# Patient Record
Sex: Male | Born: 1985 | Race: Black or African American | Hispanic: No | Marital: Single
Health system: Southern US, Community
[De-identification: ages and names within clinical notes are randomized; demographics above are authoritative.]

---
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
## 2005-07-14 ENCOUNTER — Emergency Department: Payer: Self-pay | Admitting: Emergency Medicine

## 2006-02-08 ENCOUNTER — Emergency Department: Payer: Self-pay | Admitting: Emergency Medicine

## 2011-06-26 ENCOUNTER — Emergency Department: Payer: Self-pay | Admitting: Emergency Medicine

## 2017-01-06 ENCOUNTER — Encounter: Payer: Self-pay | Admitting: Emergency Medicine

## 2017-01-06 ENCOUNTER — Emergency Department
Admission: EM | Admit: 2017-01-06 | Discharge: 2017-01-06 | Disposition: A | Discharge status: Home / Self Care | Payer: Self-pay | Attending: Emergency Medicine | Admitting: Emergency Medicine

## 2017-01-06 DIAGNOSIS — F17210 Nicotine dependence, cigarettes, uncomplicated: Secondary | ICD-10-CM | POA: Insufficient documentation

## 2017-01-06 DIAGNOSIS — K2900 Acute gastritis without bleeding: Secondary | ICD-10-CM | POA: Insufficient documentation

## 2017-01-06 LAB — COMPREHENSIVE METABOLIC PANEL
ALT: 19 U/L (ref 17–63)
AST: 27 U/L (ref 15–41)
Albumin: 4.2 g/dL (ref 3.5–5.0)
Alkaline Phosphatase: 51 U/L (ref 38–126)
Anion gap: 8 (ref 5–15)
BILIRUBIN TOTAL: 0.7 mg/dL (ref 0.3–1.2)
BUN: 11 mg/dL (ref 6–20)
CO2: 26 mmol/L (ref 22–32)
CREATININE: 0.92 mg/dL (ref 0.61–1.24)
Calcium: 9 mg/dL (ref 8.9–10.3)
Chloride: 105 mmol/L (ref 101–111)
GFR calc Af Amer: >60 mL/min (ref >60)
Glucose, Bld: 90 mg/dL (ref 65–99)
Potassium: 3.9 mmol/L (ref 3.5–5.1)
Sodium: 139 mmol/L (ref 135–145)
TOTAL PROTEIN: 7.7 g/dL (ref 6.5–8.1)

## 2017-01-06 LAB — CBC
HEMATOCRIT: 47 % (ref 40.0–52.0)
HEMOGLOBIN: 16.4 g/dL (ref 13.0–18.0)
MCH: 26.9 pg (ref 26.0–34.0)
MCHC: 34.9 g/dL (ref 32.0–36.0)
MCV: 77.1 fL — AB (ref 80.0–100.0)
Platelets: 223 10*3/uL (ref 150–440)
RBC: 6.09 MIL/uL — AB (ref 4.40–5.90)
RDW: 13.9 % (ref 11.5–14.5)
WBC: 8.3 10*3/uL (ref 3.8–10.6)

## 2017-01-06 LAB — LIPASE, BLOOD: Lipase: 24 U/L (ref 11–51)

## 2017-01-06 MED ORDER — GI COCKTAIL ~~LOC~~
ORAL | Status: AC
  Administered 2017-01-06: 30 mL via ORAL
  Filled 2017-01-06: qty 30

## 2017-01-06 MED ORDER — PANTOPRAZOLE SODIUM 20 MG PO TBEC: 20.0000 mg | DELAYED_RELEASE_TABLET | Freq: Every day | ORAL | 1 refills | Status: AC

## 2017-01-06 MED ORDER — GI COCKTAIL ~~LOC~~
30.0000 mL | Freq: Once | ORAL | Status: AC
  Administered 2017-01-06: 30 mL via ORAL

## 2017-01-06 NOTE — ED Provider Notes (Signed)
[facility] Emergency Department Provider Note   ____________________________________________    I have reviewed the triage vital signs and the nursing notes.   HISTORY  Chief Complaint Abdominal Pain     HPI Francisco [name] is a 31 y.o. male who presents with complaints of epigastric abdominal pain which she reports as moderate to severe. Patient reports the pain developed this morning has gotten worse through the day. Patient does admit to smoking marijuana and drinking lots of alcohol last night. No history of abdominal surgery. He has never had this before. Normal stools reported. Mild nausea but no vomiting   History reviewed. No pertinent past medical history.  There are no active problems to display for this patient.   History reviewed. No pertinent surgical history.  Prior to Admission medications   Not on File     Allergies Patient has no known allergies.  No family history on file.  Social History Social History  Substance Use Topics  . Smoking status: Current Every Day Smoker    Packs/day: 0.50    Types: Cigarettes  . Smokeless tobacco: Never Used  . Alcohol use Yes    Review of Systems  Constitutional: No fever/chills  Cardiovascular: Denies chest pain. Respiratory: Denies shortness of breath. Gastrointestinal: As above Genitourinary: Negative for dysuria. Musculoskeletal: Negative for back pain. Skin: Negative for rash. Neurological: Negative for headaches or weakness  10-point ROS otherwise negative.  ____________________________________________   PHYSICAL EXAM:  VITAL SIGNS: ED Triage Vitals [01/06/17 1828]  Enc Vitals Group     BP (!) 145/74     Pulse Rate 88     Resp 18     Temp 98.9 F (37.2 C)     Temp Source Oral     SpO2 98 %     Weight 160 lb (72.6 kg)     Height 5\' 7"  (1.702 m)     Head Circumference      Peak Flow      Pain Score 9     Pain Loc      Pain Edu?      Excl. in GC?      Constitutional: Alert and oriented. Uncomfortable but no acute distress Eyes: Conjunctivae are normal.  .  Mouth/Throat: Mucous membranes are moist.    Cardiovascular: Normal rate, regular rhythm. Grossly normal heart sounds.  Good peripheral circulation. Respiratory: Normal respiratory effort.  No retractions. Lungs CTAB. Gastrointestinal: Mild tenderness in epigastrium, no distention, no CVA tenderness. Guaiac negative brown stool Genitourinary: deferred Musculoskeletal: Warm and well perfused Neurologic:  Normal speech and language. No gross focal neurologic deficits are appreciated.  Skin:  Skin is warm, dry and intact. No rash noted. Psychiatric: Mood and affect are normal. Speech and behavior are normal.  ____________________________________________   LABS (all labs ordered are listed, but only abnormal results are displayed)  Labs Reviewed  CBC - Abnormal; Notable for the following:       Result Value   RBC 6.09 (*)    MCV 77.1 (*)    All other components within normal limits  LIPASE, BLOOD  COMPREHENSIVE METABOLIC PANEL   ____________________________________________  EKG  None ____________________________________________  RADIOLOGY  None ____________________________________________   PROCEDURES  Procedure(s) performed: No    Critical Care performed: No ____________________________________________   INITIAL IMPRESSION / ASSESSMENT AND PLAN / ED COURSE  Pertinent labs & imaging results that were available during my care of the patient were reviewed by me and considered in my  medical decision making (see chart for details).  Patient presents with epigastric discomfort, suspicious for gastritis versus pancreatitis given history. We will give a GI cocktail, check labs including lipase and reevaluate  ----------------------------------------- 7:51 PM on 01/06/2017 ----------------------------------------- Patient reports feeling significant better  after GI cocktail. Lab work is unremarkable. Suspect gastritis related to alcohol, we'll treat with Pepto-Bismol, PPI     ____________________________________________   FINAL CLINICAL IMPRESSION(S) / ED DIAGNOSES  Final diagnoses:  Acute gastritis without hemorrhage, unspecified gastritis type      NEW MEDICATIONS STARTED DURING THIS VISIT:  New Prescriptions   No medications on file     Note:  This document was prepared using Dragon voice recognition software and may include unintentional dictation errors.    Jene Every, MD 01/06/17 [phone]

## 2017-01-06 NOTE — ED Triage Notes (Signed)
Pt comes into the ED via POV c/o abdominal pain that started this morning.  Denies any vomiting but states he has waves of nausea.  Patient explains that the pain is upper abdomen under his ribs and sternum.  Denies any injury.  Denies any diarrhea, chest pain, or shortness of breath.  Patient states he drinks every day and drank excessively last night.  Patient has even and unlabored respirations.

## 2017-02-14 ENCOUNTER — Encounter: Payer: Self-pay | Admitting: Medical Oncology

## 2017-02-14 ENCOUNTER — Emergency Department: Payer: Self-pay

## 2017-02-14 ENCOUNTER — Emergency Department
Admission: EM | Admit: 2017-02-14 | Discharge: 2017-02-14 | Disposition: A | Discharge status: Home / Self Care | Payer: Self-pay | Attending: Emergency Medicine | Admitting: Emergency Medicine

## 2017-02-14 DIAGNOSIS — Z9289 Personal history of other medical treatment: Secondary | ICD-10-CM

## 2017-02-14 DIAGNOSIS — F17210 Nicotine dependence, cigarettes, uncomplicated: Secondary | ICD-10-CM | POA: Insufficient documentation

## 2017-02-14 DIAGNOSIS — Z8611 Personal history of tuberculosis: Secondary | ICD-10-CM | POA: Insufficient documentation

## 2017-02-14 DIAGNOSIS — Z0389 Encounter for observation for other suspected diseases and conditions ruled out: Secondary | ICD-10-CM | POA: Insufficient documentation

## 2017-02-14 NOTE — ED Notes (Signed)
See triage note  States he was tested while he was incarcerated and tested positive   Was then placed on meds  Not sure of name  Francisco [name] to [facility] for treatment and they sent him here for eval.  No sx's

## 2017-02-14 NOTE — ED Triage Notes (Signed)
Pt reports that he needs TB testing to be admitted into [facility]. Pt reports he has had positive tb tests in the past and that he usually has to have x-ray for clearance. Pt not symptomatic.

## 2017-02-14 NOTE — ED Notes (Signed)
Pt discharged home after verbalizing understanding of discharge instructions; nad noted. 

## 2017-02-14 NOTE — Discharge Instructions (Signed)
Advised to follow up with [facility].

## 2017-02-14 NOTE — ED Provider Notes (Signed)
[facility] Emergency Department Provider Note   ____________________________________________   None    (approximate)  I have reviewed the triage vital signs and the nursing notes.   HISTORY  Chief Complaint needs chest xray    HPI Francisco [name] is a 31 y.o. male patient requests chest x-ray secondary to a positive PPD test. Patient state chest x-rays needed to be admitted into [facility] clinic. Patient denies night sweats, weight loss, cough, or shortness of breath. Patient state he was positive while incarcerated and started on an unknown medication. Patient state status post release from incarceration he now needs chest x-ray to continue medication therapy. History reviewed. No pertinent past medical history.  There are no active problems to display for this patient.   History reviewed. No pertinent surgical history.  Prior to Admission medications   Medication Sig Start Date End Date Taking? Authorizing Provider  pantoprazole (PROTONIX) 20 MG tablet Take 1 tablet (20 mg total) by mouth daily. 01/06/17 01/06/18  Jene Every, MD    Allergies Patient has no known allergies.  No family history on file.  Social History Social History  Substance Use Topics  . Smoking status: Current Every Day Smoker    Packs/day: 0.50    Types: Cigarettes  . Smokeless tobacco: Never Used  . Alcohol use Yes    Review of Systems  Constitutional: No fever/chills Eyes: No visual changes. ENT: No sore throat. Cardiovascular: Denies chest pain. Respiratory: Denies shortness of breath. Gastrointestinal: No abdominal pain.  No nausea, no vomiting.  No diarrhea.  No constipation. Genitourinary: Negative for dysuria. Musculoskeletal: Negative for back pain. Skin: Negative for rash. Neurological: Negative for headaches, focal weakness or numbness.   ____________________________________________   PHYSICAL EXAM:  VITAL SIGNS: ED Triage Vitals  Enc Vitals Group    BP 02/14/17 1029 123/67     Pulse Rate 02/14/17 1029 74     Resp 02/14/17 1029 20     Temp 02/14/17 1029 98.4 F (36.9 C)     Temp Source 02/14/17 1029 Oral     SpO2 02/14/17 1029 97 %     Weight 02/14/17 1028 160 lb (72.6 kg)     Height 02/14/17 1028  (1.676 m)     Head Circumference --      Peak Flow --      Pain Score --      Pain Loc --      Pain Edu? --      Excl. in GC? --     Constitutional: Alert and oriented. Well appearing and in no acute distress. Eyes: Conjunctivae are normal. PERRL. EOMI. Head: Atraumatic. Nose: No congestion/rhinnorhea. Mouth/Throat: Mucous membranes are moist.  Oropharynx non-erythematous. Neck: No stridor.  No cervical spine tenderness to palpation. Hematological/Lymphatic/Immunilogical: No cervical lymphadenopathy. Cardiovascular: Normal rate, regular rhythm. Grossly normal heart sounds.  Good peripheral circulation. Respiratory: Normal respiratory effort.  No retractions. Lungs CTAB. Gastrointestinal: Soft and nontender. No distention. No abdominal bruits. No CVA tenderness. Musculoskeletal: No lower extremity tenderness nor edema.  No joint effusions. Neurologic:  Normal speech and language. No gross focal neurologic deficits are appreciated. No gait instability. Skin:  Skin is warm, dry and intact. No rash noted. Psychiatric: Mood and affect are normal. Speech and behavior are normal.  ____________________________________________   LABS (all labs ordered are listed, but only abnormal results are displayed)  Labs Reviewed - No data to display ____________________________________________  EKG  ____________________________________________  RADIOLOGY  No acute findings on chest  x-ray. ____________________________________________   PROCEDURES  Procedure(s) performed: None  Procedures  Critical Care performed: No  ____________________________________________   INITIAL IMPRESSION / ASSESSMENT AND PLAN / ED  COURSE  Pertinent labs & imaging results that were available during my care of the patient were reviewed by me and considered in my medical decision making (see chart for details).  Patient reports to ED for chest x-ray in order to be clear to continue treatment for positive PPD by [facility]. Discussed x-ray finding with patient showing no acute findings. Patient advised to follow-up with [facility].      ____________________________________________   FINAL CLINICAL IMPRESSION(S) / ED DIAGNOSES  Final diagnoses:  History of positive PPD      NEW MEDICATIONS STARTED DURING THIS VISIT:  New Prescriptions   No medications on file     Note:  This document was prepared using Dragon voice recognition software and may include unintentional dictation errors.    Joni Reining, PA-C 02/14/17 1107    Jene Every, MD 02/14/17 1124

## 2017-06-20 ENCOUNTER — Encounter: Payer: Self-pay | Admitting: Emergency Medicine

## 2017-06-20 ENCOUNTER — Emergency Department
Admission: EM | Admit: 2017-06-20 | Discharge: 2017-06-20 | Disposition: A | Discharge status: Home / Self Care | Payer: Self-pay | Attending: Emergency Medicine | Admitting: Emergency Medicine

## 2017-06-20 DIAGNOSIS — F17210 Nicotine dependence, cigarettes, uncomplicated: Secondary | ICD-10-CM | POA: Insufficient documentation

## 2017-06-20 DIAGNOSIS — F329 Major depressive disorder, single episode, unspecified: Secondary | ICD-10-CM | POA: Insufficient documentation

## 2017-06-20 DIAGNOSIS — Z79899 Other long term (current) drug therapy: Secondary | ICD-10-CM | POA: Insufficient documentation

## 2017-06-20 DIAGNOSIS — F32A Depression, unspecified: Secondary | ICD-10-CM

## 2017-06-20 DIAGNOSIS — R45851 Suicidal ideations: Secondary | ICD-10-CM | POA: Insufficient documentation

## 2017-06-20 DIAGNOSIS — F331 Major depressive disorder, recurrent, moderate: Secondary | ICD-10-CM

## 2017-06-20 LAB — COMPREHENSIVE METABOLIC PANEL
ALK PHOS: 54 U/L (ref 38–126)
ALT: 135 U/L — AB (ref 17–63)
AST: 240 U/L — ABNORMAL HIGH (ref 15–41)
Albumin: 4.2 g/dL (ref 3.5–5.0)
Anion gap: 7 (ref 5–15)
BUN: 11 mg/dL (ref 6–20)
CALCIUM: 9.8 mg/dL (ref 8.9–10.3)
CO2: 27 mmol/L (ref 22–32)
CREATININE: 1.15 mg/dL (ref 0.61–1.24)
Chloride: 102 mmol/L (ref 101–111)
Glucose, Bld: 95 mg/dL (ref 65–99)
Potassium: 4.1 mmol/L (ref 3.5–5.1)
SODIUM: 136 mmol/L (ref 135–145)
Total Bilirubin: 0.7 mg/dL (ref 0.3–1.2)
Total Protein: 7.7 g/dL (ref 6.5–8.1)

## 2017-06-20 LAB — CBC
HEMATOCRIT: 47.7 % (ref 40.0–52.0)
HEMOGLOBIN: 16.9 g/dL (ref 13.0–18.0)
MCH: 27.3 pg (ref 26.0–34.0)
MCHC: 35.4 g/dL (ref 32.0–36.0)
MCV: 77.1 fL — AB (ref 80.0–100.0)
Platelets: 224 10*3/uL (ref 150–440)
RBC: 6.19 MIL/uL — ABNORMAL HIGH (ref 4.40–5.90)
RDW: 13.7 % (ref 11.5–14.5)
WBC: 9.5 10*3/uL (ref 3.8–10.6)

## 2017-06-20 LAB — SALICYLATE LEVEL

## 2017-06-20 LAB — ETHANOL: Alcohol, Ethyl (B): <5 mg/dL (ref <5)

## 2017-06-20 LAB — ACETAMINOPHEN LEVEL: Acetaminophen (Tylenol), Serum: <10 ug/mL — ABNORMAL LOW (ref 10–30)

## 2017-06-20 MED ORDER — FLUOXETINE HCL 20 MG PO CAPS: 20.0000 mg | ORAL_CAPSULE | Freq: Every day | ORAL | 1 refills | Status: AC

## 2017-06-20 NOTE — Consult Note (Signed)
[place] Psychiatry Consult   Reason for Consult:  Consult for 31 year old man sent here under involuntary commitment papers from [facility] because of depression Referring Physician:  Archie Balboa Patient Identification: Francisco [name] MRN:  [id number] Principal Diagnosis: Moderate recurrent major depression (Francisco [name]) Diagnosis:   Patient Active Problem List   Diagnosis Date Noted  . Moderate recurrent major depression (Francisco [name]) [F33.1] 06/20/2017    Total Time spent with patient: 1 hour  Subjective:   Francisco [name] is a 31 y.o. male patient admitted with "it's just been a lot on me".  HPI:  Patient interviewed chart reviewed. 31 year old man says that he's been feeling depressed and under a lot of stress for a while now. He has a lot of financial problems and evidently in the last day he also had one of his utilities turned off. Had more problems related to finances. He reports that this morning he found himself feeling even more depressed than usual and thinking about suicide. Rather than acting on it he called task who took him to [place] via the crisis counselors. Patient tells me that his mood goes up and down depending on his stress level. He is particularly frustrated because he has not been able to get and hold down a steady job. His lack of finances limit his ability to care for his 57-year-old daughter and all of this makes him feel more negative. He has poor sleep and wakes up early every morning. Feels run down and tired much of the day. Denies any hallucinations. Denies any homicidal ideation. Patient says he drinks only occasionally and does not feel like he's ever had an abuse problem with it. He has used marijuana intermittently but says it's probably been a week or more since he last had any and he is trying not to use regularly. He is not currently on any medicine. He was prescribed Prozac by [facility] but did not get the prescription filled and then misplaced it.  Social history: Living in a rooming house.  He takes responsibility for caring for his 33-year-old daughter during the daytime all the mother is at work. He feels ashamed of himself for not having a steady job. Patient is on probation and reports that the last job that he did have he lost when his probation officer called his work. He feels, with what sounds like good reason, taken advantage of by the probation system  Medical history: No significant medical problems  Substance abuse history: History of marijuana abuse. Says he is not using regularly and not using any other drugs.  Past Psychiatric History: Patient had a bout of serious depression while he was in prison. Made a suicide attempt by hanging while incarcerated. Has never been admitted to a psychiatric hospital. Has recently been following up with outpatient providers for substance abuse and mental health follow-up in the community. He thinks he was on antidepressants while in prison but cannot remember what they were. No history of mania or psychosis  Risk to Self: Is patient at risk for suicide?: No Risk to Others:   Prior Inpatient Therapy:   Prior Outpatient Therapy:    Past Medical History: History reviewed. No pertinent past medical history. History reviewed. No pertinent surgical history. Family History: No family history on file. Family Psychiatric  History: Does not know of any Social History:  History  Alcohol Use  . Yes     History  Drug Use  . Types: Marijuana    Social History   Social History  .  Marital status: Single    Spouse name: N/A  . Number of children: N/A  . Years of education: N/A   Social History Main Topics  . Smoking status: Current Every Day Smoker    Packs/day: 0.50    Types: Cigarettes  . Smokeless tobacco: Never Used  . Alcohol use Yes  . Drug use: Yes    Types: Marijuana  . Sexual activity: Not Asked   Other Topics Concern  . None   Social History Narrative  . None   Additional Social History:    Allergies:  No  Known Allergies  Labs:  Results for orders placed or performed during the hospital encounter of 06/20/17 (from the past 48 hour(s))  Comprehensive metabolic panel     Status: Abnormal   Collection Time: 06/20/17  5:11 PM  Result Value Ref Range   Sodium 136 135 - 145 mmol/L   Potassium 4.1 3.5 - 5.1 mmol/L   Chloride 102 101 - 111 mmol/L   CO2 27 22 - 32 mmol/L   Glucose, Bld 95 65 - 99 mg/dL   BUN 11 6 - 20 mg/dL   Creatinine, Ser 1.15 0.61 - 1.24 mg/dL   Calcium 9.8 8.9 - 10.3 mg/dL   Total Protein 7.7 6.5 - 8.1 g/dL   Albumin 4.2 3.5 - 5.0 g/dL   AST 240 (H) 15 - 41 U/L   ALT 135 (H) 17 - 63 U/L   Alkaline Phosphatase 54 38 - 126 U/L   Total Bilirubin 0.7 0.3 - 1.2 mg/dL   GFR calc non Af Amer >60 >60 mL/min   GFR calc Af Amer >60 >60 mL/min    Comment: (NOTE) The eGFR has been calculated using the CKD EPI equation. This calculation has not been validated in all clinical situations. eGFR's persistently <60 mL/min signify possible Chronic Kidney Disease.    Anion gap 7 5 - 15  Ethanol     Status: None   Collection Time: 06/20/17  5:11 PM  Result Value Ref Range   Alcohol, Ethyl (B) <5 <5 mg/dL    Comment:        LOWEST DETECTABLE LIMIT FOR SERUM ALCOHOL IS 5 mg/dL FOR MEDICAL PURPOSES ONLY   Salicylate level     Status: None   Collection Time: 06/20/17  5:11 PM  Result Value Ref Range   Salicylate Lvl <5.7 2.8 - 30.0 mg/dL  Acetaminophen level     Status: Abnormal   Collection Time: 06/20/17  5:11 PM  Result Value Ref Range   Acetaminophen (Tylenol), Serum <10 (L) 10 - 30 ug/mL    Comment:        THERAPEUTIC CONCENTRATIONS VARY SIGNIFICANTLY. A RANGE OF 10-30 ug/mL MAY BE AN EFFECTIVE CONCENTRATION FOR MANY PATIENTS. HOWEVER, SOME ARE BEST TREATED AT CONCENTRATIONS OUTSIDE THIS RANGE. ACETAMINOPHEN CONCENTRATIONS >150 ug/mL AT 4 HOURS AFTER INGESTION AND >50 ug/mL AT 12 HOURS AFTER INGESTION ARE OFTEN ASSOCIATED WITH TOXIC REACTIONS.   cbc     Status:  Abnormal   Collection Time: 06/20/17  5:11 PM  Result Value Ref Range   WBC 9.5 3.8 - 10.6 K/uL   RBC 6.19 (H) 4.40 - 5.90 MIL/uL   Hemoglobin 16.9 13.0 - 18.0 g/dL   HCT 47.7 40.0 - 52.0 %   MCV 77.1 (L) 80.0 - 100.0 fL   MCH 27.3 26.0 - 34.0 pg   MCHC 35.4 32.0 - 36.0 g/dL   RDW 13.7 11.5 - 14.5 %   Platelets 224 150 -  440 K/uL    No current facility-administered medications for this encounter.    Current Outpatient Prescriptions  Medication Sig Dispense Refill  . FLUoxetine (PROZAC) 20 MG capsule Take 1 capsule (20 mg total) by mouth daily. 30 capsule 1  . pantoprazole (PROTONIX) 20 MG tablet Take 1 tablet (20 mg total) by mouth daily. 20 tablet 1    Musculoskeletal: Strength & Muscle Tone: within normal limits Gait & Station: normal Patient leans: N/A  Psychiatric Specialty Exam: Physical Exam  Nursing note and vitals reviewed. Constitutional: He appears well-developed and well-nourished.  HENT:  Head: Normocephalic and atraumatic.  Eyes: Pupils are equal, round, and reactive to light. Conjunctivae are normal.  Neck: Normal range of motion.  Cardiovascular: Regular rhythm and normal heart sounds.   Respiratory: Effort normal. No respiratory distress.  GI: Soft.  Musculoskeletal: Normal range of motion.  Neurological: He is alert.  Skin: Skin is warm and dry.  Psychiatric: He has a normal mood and affect. His speech is normal and behavior is normal. Judgment and thought content normal. Thought content is not paranoid. Cognition and memory are normal. He expresses no homicidal and no suicidal ideation.    Review of Systems  Constitutional: Negative.   HENT: Negative.   Eyes: Negative.   Respiratory: Negative.   Cardiovascular: Negative.   Gastrointestinal: Negative.   Musculoskeletal: Negative.   Skin: Negative.   Neurological: Negative.   Psychiatric/Behavioral: Positive for depression. Negative for hallucinations, memory loss, substance abuse and suicidal  ideas. The patient has insomnia. The patient is not nervous/anxious.     Blood pressure (!) 149/87, pulse 67, resp. rate 16, height 5' 7" (1.702 m), weight 160 lb (72.6 kg), SpO2 98 %.Body mass index is 25.06 kg/m.  General Appearance: Fairly Groomed  Eye Contact:  Fair  Speech:  Clear and Coherent  Volume:  Normal  Mood:  Dysphoric  Affect:  Appropriate and Congruent  Thought Process:  Goal Directed  Orientation:  Full (Time, Place, and Person)  Thought Content:  Logical  Suicidal Thoughts:  No  Homicidal Thoughts:  No  Memory:  Immediate;   Good Recent;   Fair Remote;   Fair  Judgement:  Fair  Insight:  Fair  Psychomotor Activity:  Normal  Concentration:  Concentration: Good  Recall:  Good  Fund of Knowledge:  Good  Language:  Good  Akathisia:  No  Handed:  Right  AIMS (if indicated):     Assets:  Communication Skills Desire for Improvement Physical Health Resilience Social Support  ADL's:  Intact  Cognition:  WNL  Sleep:        Treatment Plan Summary: Medication management and Plan 31 year old man who has a lot of situational stress on him. Found himself this morning having suicidal thoughts although they never were more clear than the idea of walking in front of traffic. He did not actually act on them. Instead he did the appropriate thing of reaching out for help. Patient is not reporting any suicidal intent or plan currently. He is able to articulate several positive things in his life and is clearly looking forward to several things not least of which is completing his probation. Patient is not psychotic not intoxicated and is agreeable to appropriate outpatient treatment. I don't think he meets commitment criteria at this point nor do I think he necessarily needs any inpatient treatment. I have agreed to give him a replacement prescription for the fluoxetine 20 mg a day with an additional refill. He is encouraged  to continue following up with [facility] and [facility]. He is  agreeable to all of this. Case reviewed with emergency room physician. Patient can be discharged from the emergency room at the discretion of the ER physician.  Disposition: No evidence of imminent risk to self or others at present.   Patient does not meet criteria for psychiatric inpatient admission. Supportive therapy provided about ongoing stressors.  Alethia Berthold, MD 06/20/2017 6:01 PM

## 2017-06-20 NOTE — ED Triage Notes (Signed)
Pt presents to ed via [facility] with IVC papers in hand. Reports pt is suicidal and homicidal if he could get a hold of a gun he would shoot himself and possibly someone else. Pt recently feels loss of support system, lost his job and feels like he cannot provide for his 9five year old daughter. Pt brought over from [facility].

## 2017-06-20 NOTE — ED Notes (Signed)

## 2017-06-20 NOTE — ED Notes (Signed)
BEHAVIORAL HEALTH ROUNDING Patient sleeping: No. Patient alert and oriented: yes Behavior appropriate: Yes.  ; If no, describe:  Nutrition and fluids offered: yes Toileting and hygiene offered: Yes  Sitter present: q15 minute observations and security  monitoring Law enforcement present: Yes  ODS  

## 2017-06-20 NOTE — ED Provider Notes (Signed)
[facility] Emergency Department Provider Note   ____________________________________________   I have reviewed the triage vital signs and the nursing notes.   HISTORY  Chief Complaint Suicidal; Homicidal; and Depression   History limited by: Not Limited   HPI Francisco [name] is a 31 y.o. male who presents to the emergency department today under IVC because of concerns for depression and suicidal ideation. Patient does have a history of depression. Earlier today he said he had a bad episode where he was feeling more suicidal. Did have a plan to walk and front of the road. The time of my examination patient states he feels better. He had discussed with her psychiatrist here and states he feels comfortable going home.    History reviewed. No pertinent past medical history.  There are no active problems to display for this patient.   History reviewed. No pertinent surgical history.  Prior to Admission medications   Medication Sig Start Date End Date Taking? Authorizing Provider  pantoprazole (PROTONIX) 20 MG tablet Take 1 tablet (20 mg total) by mouth daily. 01/06/17 01/06/18  Jene Every, MD    Allergies Patient has no known allergies.  No family history on file.  Social History Social History  Substance Use Topics  . Smoking status: Current Every Day Smoker    Packs/day: 0.50    Types: Cigarettes  . Smokeless tobacco: Never Used  . Alcohol use Yes    Review of Systems Constitutional: No fever/chills Eyes: No visual changes. ENT: No sore throat. Cardiovascular: Denies chest pain. Respiratory: Denies shortness of breath. Gastrointestinal: No abdominal pain.  No nausea, no vomiting.  No diarrhea.   Genitourinary: Negative for dysuria. Musculoskeletal: Negative for back pain. Skin: Negative for rash. Neurological: Negative for headaches, focal weakness or numbness.  ____________________________________________   PHYSICAL EXAM:  VITAL  SIGNS: ED Triage Vitals [06/20/17 1705]  Enc Vitals Group     BP (!) 149/87     Pulse Rate 67     Resp 16     Temp      Temp src      SpO2 98 %     Weight 160 lb (72.6 kg)     Height 5\' 7"  (1.702 m)    Constitutional: Alert and oriented. Well appearing and in no distress. Eyes: Conjunctivae are normal.  ENT   Head: Normocephalic and atraumatic.   Nose: No congestion/rhinnorhea.   Mouth/Throat: Mucous membranes are moist.   Neck: No stridor. Hematological/Lymphatic/Immunilogical: No cervical lymphadenopathy. Cardiovascular: Normal rate, regular rhythm.  No murmurs, rubs, or gallops.  Respiratory: Normal respiratory effort without tachypnea nor retractions. Breath sounds are clear and equal bilaterally. No wheezes/rales/rhonchi. Gastrointestinal: Soft and non tender. No rebound. No guarding.  Genitourinary: Deferred Musculoskeletal: Normal range of motion in all extremities.  Neurologic:  Normal speech and language. No gross focal neurologic deficits are appreciated.  Skin:  Skin is warm, dry and intact. No rash noted. Psychiatric: Mood and affect are normal. Speech and behavior are normal. Patient exhibits appropriate insight and judgment.  ____________________________________________    LABS (pertinent positives/negatives)  Labs Reviewed  COMPREHENSIVE METABOLIC PANEL - Abnormal; Notable for the following:       Result Value   AST 240 (*)    ALT 135 (*)    All other components within normal limits  ACETAMINOPHEN LEVEL - Abnormal; Notable for the following:    Acetaminophen (Tylenol), Serum <10 (*)    All other components within normal limits  CBC - Abnormal;  Notable for the following:    RBC 6.19 (*)    MCV 77.1 (*)    All other components within normal limits  ETHANOL  SALICYLATE LEVEL  URINE DRUG SCREEN, QUALITATIVE ([facility] ONLY)     ____________________________________________   EKG  None  ____________________________________________     RADIOLOGY  None  ____________________________________________   PROCEDURES  Procedures  ____________________________________________   INITIAL IMPRESSION / ASSESSMENT AND PLAN / ED COURSE  Pertinent labs & imaging results that were available during my care of the patient were reviewed by me and considered in my medical decision making (see chart for details).   Patient presented to the emergency department today because of concerns for depression and suicidal ideation. By the time my exam patient states he felt much better. He was not provided by psychiatry here who felt he could be safely discharged home. The patient feels comfortable going home. Psychiatry did provide a prescription for the patient's medication. ____________________________________________   FINAL CLINICAL IMPRESSION(S) / ED DIAGNOSES  Final diagnoses:  Depression, unspecified depression type     Note: This dictation was prepared with Dragon dictation. Any transcriptional errors that result from this process are unintentional     Phineas SemenGoodman, Midori Dado, MD 06/20/17 [phone]

## 2017-06-20 NOTE — Discharge Instructions (Signed)
Please seek medical attention for any high fevers, chest pain, shortness of breath, change in behavior, persistent vomiting, bloody stool or any other new or concerning symptoms.  

## 2018-02-11 ENCOUNTER — Emergency Department
Admission: EM | Admit: 2018-02-11 | Discharge: 2018-02-11 | Disposition: A | Discharge status: Home / Self Care | Payer: Self-pay | Attending: Emergency Medicine | Admitting: Emergency Medicine

## 2018-02-11 ENCOUNTER — Encounter: Payer: Self-pay | Admitting: Emergency Medicine

## 2018-02-11 ENCOUNTER — Other Ambulatory Visit: Payer: Self-pay

## 2018-02-11 DIAGNOSIS — M2550 Pain in unspecified joint: Secondary | ICD-10-CM | POA: Insufficient documentation

## 2018-02-11 DIAGNOSIS — F17210 Nicotine dependence, cigarettes, uncomplicated: Secondary | ICD-10-CM | POA: Insufficient documentation

## 2018-02-11 DIAGNOSIS — Z79899 Other long term (current) drug therapy: Secondary | ICD-10-CM | POA: Insufficient documentation

## 2018-02-11 LAB — CBC WITH DIFFERENTIAL/PLATELET
BASOS ABS: 0.1 10*3/uL (ref 0–0.1)
Basophils Relative: 1 %
Eosinophils Absolute: 0.3 10*3/uL (ref 0–0.7)
Eosinophils Relative: 2 %
HEMATOCRIT: 49.2 % (ref 40.0–52.0)
Hemoglobin: 16.5 g/dL (ref 13.0–18.0)
LYMPHS PCT: 26 %
Lymphs Abs: 3.2 10*3/uL (ref 1.0–3.6)
MCH: 25.5 pg — ABNORMAL LOW (ref 26.0–34.0)
MCHC: 33.6 g/dL (ref 32.0–36.0)
MCV: 75.7 fL — AB (ref 80.0–100.0)
MONO ABS: 0.7 10*3/uL (ref 0.2–1.0)
Monocytes Relative: 6 %
NEUTROS ABS: 7.9 10*3/uL — AB (ref 1.4–6.5)
Neutrophils Relative %: 65 %
Platelets: 295 10*3/uL (ref 150–440)
RBC: 6.49 MIL/uL — ABNORMAL HIGH (ref 4.40–5.90)
RDW: 16.5 % — ABNORMAL HIGH (ref 11.5–14.5)
WBC: 12.1 10*3/uL — ABNORMAL HIGH (ref 3.8–10.6)

## 2018-02-11 LAB — COMPREHENSIVE METABOLIC PANEL
ALK PHOS: 69 U/L (ref 38–126)
ALT: 12 U/L — AB (ref 17–63)
AST: 20 U/L (ref 15–41)
Albumin: 4.1 g/dL (ref 3.5–5.0)
Anion gap: 7 (ref 5–15)
BILIRUBIN TOTAL: 1.2 mg/dL (ref 0.3–1.2)
BUN: 12 mg/dL (ref 6–20)
CALCIUM: 8.8 mg/dL — AB (ref 8.9–10.3)
CO2: 24 mmol/L (ref 22–32)
CREATININE: 1.16 mg/dL (ref 0.61–1.24)
Chloride: 104 mmol/L (ref 101–111)
GFR calc Af Amer: >60 mL/min (ref >60)
GFR calc non Af Amer: >60 mL/min (ref >60)
Glucose, Bld: 96 mg/dL (ref 65–99)
Potassium: 4 mmol/L (ref 3.5–5.1)
Sodium: 135 mmol/L (ref 135–145)
TOTAL PROTEIN: 8 g/dL (ref 6.5–8.1)

## 2018-02-11 LAB — TSH: TSH: 1.585 u[IU]/mL (ref 0.350–4.500)

## 2018-02-11 LAB — BRAIN NATRIURETIC PEPTIDE: B NATRIURETIC PEPTIDE 5: 36 pg/mL (ref 0.0–100.0)

## 2018-02-11 MED ORDER — NAPROXEN 500 MG PO TABS: 500.0000 mg | ORAL_TABLET | Freq: Two times a day (BID) | ORAL | 0 refills | Status: AC

## 2018-02-11 MED ORDER — PREDNISONE 20 MG PO TABS: 60.0000 mg | ORAL_TABLET | Freq: Every day | ORAL | 0 refills | Status: AC

## 2018-02-11 MED ORDER — PREDNISONE 20 MG PO TABS
60.0000 mg | ORAL_TABLET | Freq: Once | ORAL | Status: AC
  Administered 2018-02-11: 60 mg via ORAL
  Filled 2018-02-11: qty 3

## 2018-02-11 MED ORDER — NAPROXEN 500 MG PO TABS
500.0000 mg | ORAL_TABLET | Freq: Once | ORAL | Status: AC
  Administered 2018-02-11: 500 mg via ORAL
  Filled 2018-02-11: qty 1

## 2018-02-11 NOTE — ED Provider Notes (Signed)
[facility] Emergency Department Provider Note ____________________________________________   First MD Initiated Contact with Patient 02/11/18 1327     (approximate)  I have reviewed the triage vital signs and the nursing notes.   HISTORY  Chief Complaint Hands and feet swelling    HPI Francisco [name] is a 32 y.o. male with no significant past medical history who presents with swelling and pain to bilateral hands and feet over the last 2 to 3 weeks, intermittent, mainly occurring later in the day in the evening, and then resolving.  He states it is become painful to walk.  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  He has no prior history of these symptoms before these last few weeks.  No associated fever, redness or rash, or any other generalized symptoms.   History reviewed. No pertinent past medical history.  Patient Active Problem List   Diagnosis Date Noted  . 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  Prior to Admission medications   Medication Sig Start Date End Date Taking? Authorizing Provider  FLUoxetine (PROZAC) 20 MG capsule Take 1 capsule (20 mg total) by mouth daily. 06/20/17 06/20/18  Clapacs, Jackquline Denmark, MD  naproxen (NAPROSYN) 500 MG tablet Take 1 tablet (500 mg total) by mouth 2 (two) times daily with a meal. 02/11/18 03/13/18  Dionne Bucy, MD  pantoprazole (PROTONIX) 20 MG tablet Take 1 tablet (20 mg total) by mouth daily. 01/06/17 01/06/18  Jene Every, MD  predniSONE (DELTASONE) 20 MG tablet Take 3 tablets (60 mg total) by mouth daily with breakfast for 4 days. 02/11/18 02/15/18  Dionne Bucy, MD    Allergies Patient has no known allergies.  No family history on file.  Social History Social History   Tobacco Use  . Smoking status: Current Every Day [name]    Packs/day: 0.50    Types: Cigarettes  . Smokeless tobacco: Never Used    Substance Use Topics  . Alcohol use: Yes  . Drug use: Yes    Types: Marijuana    Review of Systems  Constitutional: No fever. Eyes: No visual changes. ENT: No neck pain. Cardiovascular: Denies chest pain. Respiratory: Denies shortness of breath. Gastrointestinal: No nausea, no vomiting.  No diarrhea.  Genitourinary: Negative for dysuria.  Musculoskeletal: Positive for extremity pain. Skin: Negative for rash. Neurological: Negative for headaches, focal weakness or numbness.   ____________________________________________   PHYSICAL EXAM:  VITAL SIGNS: ED Triage Vitals  Enc Vitals Group     BP 02/11/18 1143 116/69     Pulse Rate 02/11/18 1143 65     Resp 02/11/18 1143 16     Temp 02/11/18 1143 98.4 F (36.9 C)     Temp Source 02/11/18 1143 Oral     SpO2 02/11/18 1143 97 %     Weight 02/11/18 1142 189 lb (85.7 kg)     Height 02/11/18 1142  (1.676 m)     Head Circumference --      Peak Flow --      Pain Score 02/11/18 1141 7     Pain Loc --      Pain Edu? --      Excl. in GC? --     Constitutional: Alert and oriented. Well appearing and in no acute distress. Eyes: Conjunctivae are normal.  Head: Atraumatic. Nose: No congestion/rhinnorhea. Mouth/Throat: Mucous membranes are moist.  Neck: Normal range of motion.  Cardiovascular: Good peripheral circulation. Respiratory: Normal respiratory effort.   Gastrointestinal:  No distention.  Musculoskeletal: No extremity edema.  No visible or palpable swelling.  2+ distal pulses to all extremities.  Extremities warm and well perfused.  FROM at all joints. Neurologic:  Normal speech and language.  Motor and sensory intact in all extremities.  No gross focal neurologic deficits are appreciated.  Skin:  Skin is warm and dry. No rash noted. Psychiatric: Mood and affect are normal. Speech and behavior are normal.  ____________________________________________   LABS (all labs ordered are listed, but only abnormal  results are displayed)  Labs Reviewed  CBC WITH DIFFERENTIAL/PLATELET - Abnormal; Notable for the following components:      Result Value   WBC 12.1 (*)    RBC 6.49 (*)    MCV 75.7 (*)    MCH 25.5 (*)    RDW 16.5 (*)    Neutro Abs 7.9 (*)    All other components within normal limits  COMPREHENSIVE METABOLIC PANEL - Abnormal; Notable for the following components:   Calcium 8.8 (*)    ALT 12 (*)    All other components within normal limits  BRAIN NATRIURETIC PEPTIDE  TSH   ____________________________________________  EKG   ____________________________________________  RADIOLOGY    ____________________________________________   PROCEDURES  Procedure(s) performed: No  Procedures  Critical Care performed: No ____________________________________________   INITIAL IMPRESSION / ASSESSMENT AND PLAN / ED COURSE  Pertinent labs & imaging results that were available during my care of the patient were reviewed by me and considered in my medical decision making (see chart for details).  32 year old male with no significant past medical history presents with pain and swelling to bilateral hands and legs intermittently and occurring mainly in the evening.  No systemic symptoms.  He is having the pain currently especially in his feet, but no swelling at this time.  On exam, the patient is well-appearing, vital signs are normal, and there are no significant findings on exam of the extremities.  Differential includes primarily inflammatory causes or autoimmune.  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  There is no evidence of DVT or other vascular cause given the intermittent nature of the symptoms, and I do not suspect gout given that it is diffuse and intermittent.  There is also no evidence of neuropathy.  Plan: Basic labs, TSH, symptomatic treatment with NSAIDs, and I will plan for discharge with a course of steroid for  inflammation and primary care referral for further outpatient work-up if needed.    ----------------------------------------- 2:45 PM on 02/11/2018 -----------------------------------------  Lab work-up unremarkable except for minimally elevated WBC count.  Patient stable for discharge home.  I explained the results of the work-up and the possible diagnoses as well as the importance of follow-up.  Patient will be given primary care referral.  Return precautions given and he expresses understanding.  ____________________________________________   FINAL CLINICAL IMPRESSION(S) / ED DIAGNOSES  Final diagnoses:  Arthralgia, unspecified joint      NEW MEDICATIONS STARTED DURING THIS VISIT:  New Prescriptions   NAPROXEN (NAPROSYN) 500 MG TABLET    Take 1 tablet (500 mg total) by mouth 2 (two) times daily with a meal.   PREDNISONE (DELTASONE) 20 MG TABLET    Take 3 tablets (60 mg total) by mouth daily with breakfast for 4 days.     Note:  This document was prepared  using Conservation officer, historic buildings and may include unintentional dictation errors.    Dionne Bucy, MD 02/11/18 1446

## 2018-02-11 NOTE — ED Triage Notes (Signed)
First nurse note: Patient c/o swollen hands and feet for two weeks. Patient c/o difficulty walking due to the swollen feet. Patient c/o itching hands that preceded the swelling.

## 2018-02-11 NOTE — Discharge Instructions (Addendum)
It is important that you follow-up with a primary care doctor; we have provided a referral to the [facility] for you to arrange for follow-up within the next few weeks.  Take the prednisone as prescribed for the next 4 days (starting the day after the ER visit) and take the Naprosyn as needed twice daily for pain.  Return to the ER for new, worsening, or persistent pain, swelling, weakness or numbness, rash, fevers, or any other new or worsening symptoms that concern you.

## 2018-02-11 NOTE — ED Triage Notes (Signed)
Pt to ED via POV c/o swelling in both hands and feet. Pt states  That this has been going on for about 2.5 weeks. Pt states that it is very painful to walk. Pt denies any past medical history. States that he has never had anything like this happen before. Pt denies any chest pain of shortness of breath.

## 2018-06-27 IMAGING — CR DG CHEST 2V
1 series · 2 of 2 positions shown · non-contrast
Comparison: None.

CLINICAL DATA: Positive tuberculin skin test

EXAM:
CHEST  2 VIEW

[Series 1: dg chest 2 view · 0.14mm/px · 2 of 2 slices shown]
[im 1/2]
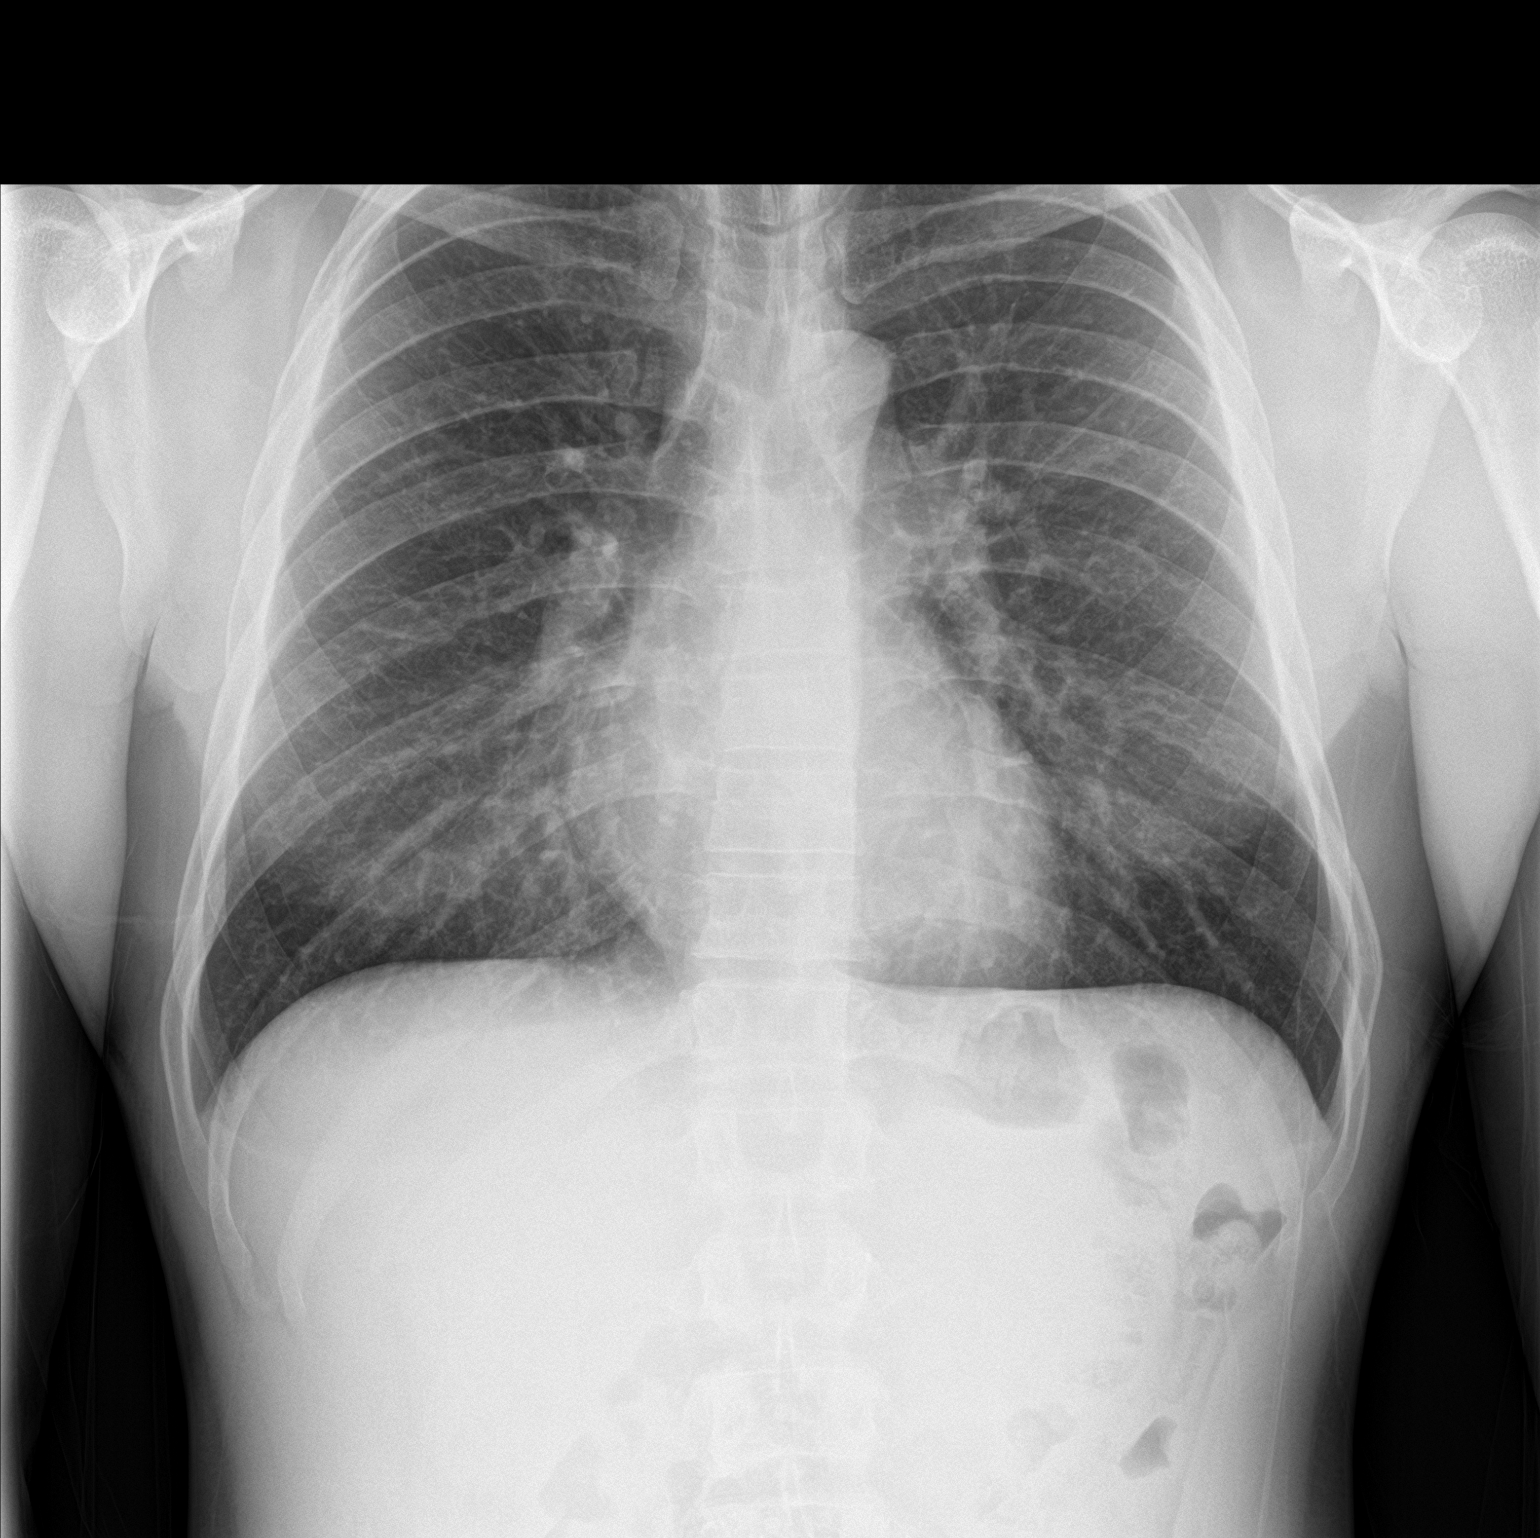
[im 2/2]
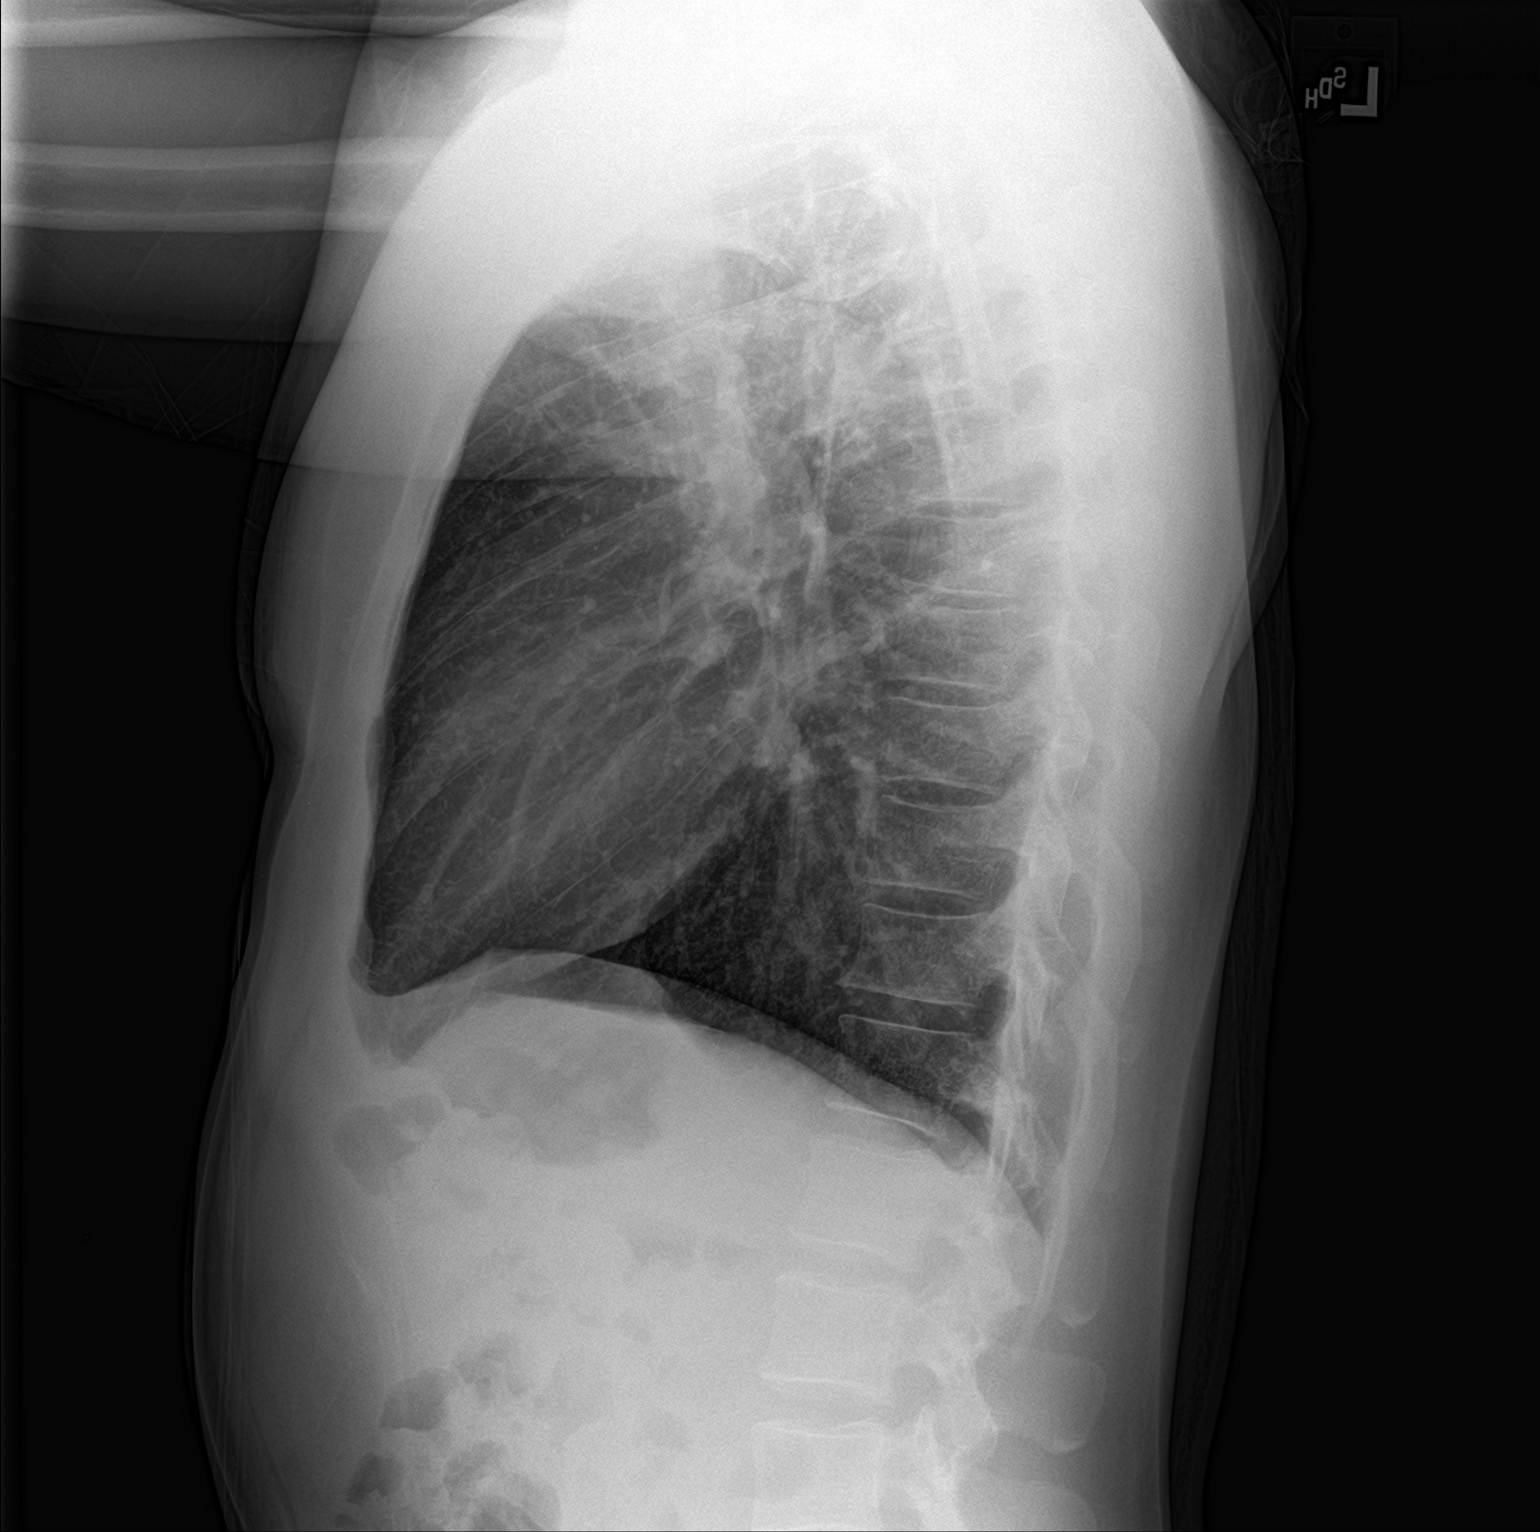

[2 of 2 positions shown; findings below may reference images not displayed]

FINDINGS: Lungs are clear. Heart size and pulmonary vascularity are normal. No
adenopathy. No bone lesions.
IMPRESSION: No abnormality noted.

## 2019-09-05 ENCOUNTER — Other Ambulatory Visit: Payer: Self-pay

## 2019-09-05 DIAGNOSIS — Z20822 Contact with and (suspected) exposure to covid-19: Secondary | ICD-10-CM

## 2019-09-08 LAB — NOVEL CORONAVIRUS, NAA: SARS-CoV-2, NAA: NOT DETECTED

## 2019-11-01 ENCOUNTER — Ambulatory Visit: Payer: Self-pay | Attending: Internal Medicine

## 2019-11-01 DIAGNOSIS — Z20822 Contact with and (suspected) exposure to covid-19: Secondary | ICD-10-CM | POA: Insufficient documentation

## 2019-11-02 LAB — NOVEL CORONAVIRUS, NAA: SARS-CoV-2, NAA: NOT DETECTED

## 2022-04-30 ENCOUNTER — Emergency Department
Admission: EM | Admit: 2022-04-30 | Discharge: 2022-04-30 | Disposition: A | Discharge status: Home / Self Care | Payer: Self-pay | Attending: Emergency Medicine | Admitting: Emergency Medicine

## 2022-04-30 ENCOUNTER — Other Ambulatory Visit: Payer: Self-pay

## 2022-04-30 ENCOUNTER — Emergency Department: Payer: Self-pay

## 2022-04-30 DIAGNOSIS — R4182 Altered mental status, unspecified: Secondary | ICD-10-CM | POA: Insufficient documentation

## 2022-04-30 DIAGNOSIS — F10920 Alcohol use, unspecified with intoxication, uncomplicated: Secondary | ICD-10-CM

## 2022-04-30 DIAGNOSIS — Y908 Blood alcohol level of 240 mg/100 ml or more: Secondary | ICD-10-CM | POA: Insufficient documentation

## 2022-04-30 DIAGNOSIS — F10129 Alcohol abuse with intoxication, unspecified: Secondary | ICD-10-CM | POA: Insufficient documentation

## 2022-04-30 LAB — COMPREHENSIVE METABOLIC PANEL
ALT: 19 U/L (ref 0–44)
AST: 23 U/L (ref 15–41)
Albumin: 4.3 g/dL (ref 3.5–5.0)
Alkaline Phosphatase: 51 U/L (ref 38–126)
Anion gap: 8 (ref 5–15)
BUN: 10 mg/dL (ref 6–20)
CO2: 23 mmol/L (ref 22–32)
Calcium: 9.4 mg/dL (ref 8.9–10.3)
Chloride: 109 mmol/L (ref 98–111)
Creatinine, Ser: 1.16 mg/dL (ref 0.61–1.24)
GFR, Estimated: >60 mL/min (ref >60)
Glucose, Bld: 109 mg/dL — ABNORMAL HIGH (ref 70–99)
Potassium: 3 mmol/L — ABNORMAL LOW (ref 3.5–5.1)
Sodium: 140 mmol/L (ref 135–145)
Total Bilirubin: 0.6 mg/dL (ref 0.3–1.2)
Total Protein: 8.1 g/dL (ref 6.5–8.1)

## 2022-04-30 LAB — CBC WITH DIFFERENTIAL/PLATELET
Abs Immature Granulocytes: 0.03 10*3/uL (ref 0.00–0.07)
Basophils Absolute: 0.1 10*3/uL (ref 0.0–0.1)
Basophils Relative: 1 %
Eosinophils Absolute: 0.4 10*3/uL (ref 0.0–0.5)
Eosinophils Relative: 4 %
HCT: 47.5 % (ref 39.0–52.0)
Hemoglobin: 16.7 g/dL (ref 13.0–17.0)
Immature Granulocytes: 0 %
Lymphocytes Relative: 37 %
Lymphs Abs: 4 10*3/uL (ref 0.7–4.0)
MCH: 26.7 pg (ref 26.0–34.0)
MCHC: 35.2 g/dL (ref 30.0–36.0)
MCV: 76 fL — ABNORMAL LOW (ref 80.0–100.0)
Monocytes Absolute: 0.7 10*3/uL (ref 0.1–1.0)
Monocytes Relative: 6 %
Neutro Abs: 5.7 10*3/uL (ref 1.7–7.7)
Neutrophils Relative %: 52 %
Platelets: 266 10*3/uL (ref 150–400)
RBC: 6.25 MIL/uL — ABNORMAL HIGH (ref 4.22–5.81)
RDW: 14 % (ref 11.5–15.5)
WBC: 10.9 10*3/uL — ABNORMAL HIGH (ref 4.0–10.5)
nRBC: 0 % (ref 0.0–0.2)

## 2022-04-30 LAB — URINE DRUG SCREEN, QUALITATIVE (ARMC ONLY)
Amphetamines, Ur Screen: NOT DETECTED
Barbiturates, Ur Screen: NOT DETECTED
Benzodiazepine, Ur Scrn: NOT DETECTED
Cannabinoid 50 Ng, Ur ~~LOC~~: POSITIVE — AB
Cocaine Metabolite,Ur ~~LOC~~: NOT DETECTED
MDMA (Ecstasy)Ur Screen: NOT DETECTED
Methadone Scn, Ur: NOT DETECTED
Opiate, Ur Screen: NOT DETECTED
Phencyclidine (PCP) Ur S: NOT DETECTED
Tricyclic, Ur Screen: NOT DETECTED

## 2022-04-30 LAB — ETHANOL: Alcohol, Ethyl (B): 257 mg/dL — ABNORMAL HIGH (ref <10)

## 2022-04-30 MED ORDER — POTASSIUM CHLORIDE CRYS ER 20 MEQ PO TBCR
40.0000 meq | EXTENDED_RELEASE_TABLET | Freq: Once | ORAL | Status: AC
  Administered 2022-04-30: 40 meq via ORAL
  Filled 2022-04-30: qty 2
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
## 2022-04-30 NOTE — ED Notes (Signed)
Officer in room, pt refusing to eat at this time. Pt back to sleep at this time.
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
## 2022-04-30 NOTE — ED Notes (Signed)
Pt more alert, officer in room, pt drinking, tolerating well, pt given snacks to eat and warm blanket.

## 2022-04-30 NOTE — ED Notes (Signed)
Pt alert, responsive to voice, pt tolerated po crackers and liquids without nausea.
